# Patient Record
Sex: Male | Born: 1950 | Race: White | Hispanic: No | Smoking: Former smoker
Health system: Southern US, Community
[De-identification: ages and names within clinical notes are randomized; demographics above are authoritative.]

## PROBLEM LIST (undated history)

## (undated) DIAGNOSIS — K219 Gastro-esophageal reflux disease without esophagitis: Secondary | ICD-10-CM

## (undated) DIAGNOSIS — B019 Varicella without complication: Secondary | ICD-10-CM

## (undated) DIAGNOSIS — M1990 Unspecified osteoarthritis, unspecified site: Secondary | ICD-10-CM

## (undated) DIAGNOSIS — K635 Polyp of colon: Secondary | ICD-10-CM

## (undated) DIAGNOSIS — H269 Unspecified cataract: Secondary | ICD-10-CM

## (undated) HISTORY — DX: Polyp of colon: K63.5

## (undated) HISTORY — PX: OTHER SURGICAL HISTORY: SHX169

## (undated) HISTORY — PX: ANKLE SURGERY: SHX546

## (undated) HISTORY — PX: SPINE SURGERY: SHX786

## (undated) HISTORY — PX: COLONOSCOPY: SHX174

## (undated) HISTORY — PX: ROTATOR CUFF REPAIR: SHX139

## (undated) HISTORY — DX: Gastro-esophageal reflux disease without esophagitis: K21.9

## (undated) HISTORY — DX: Varicella without complication: B01.9

## (undated) HISTORY — PX: POLYPECTOMY: SHX149

## (undated) HISTORY — DX: Unspecified osteoarthritis, unspecified site: M19.90

## (undated) HISTORY — DX: Unspecified cataract: H26.9

---

## 1999-04-07 ENCOUNTER — Ambulatory Visit (HOSPITAL_COMMUNITY): Admission: RE | Admit: 1999-04-07 | Discharge: 1999-04-07 | Payer: Self-pay | Admitting: Neurosurgery

## 1999-05-26 ENCOUNTER — Observation Stay (HOSPITAL_COMMUNITY): Admission: RE | Admit: 1999-05-26 | Discharge: 1999-05-27 | Payer: Self-pay | Admitting: Neurosurgery

## 1999-08-01 ENCOUNTER — Encounter: Admission: RE | Admit: 1999-08-01 | Discharge: 1999-08-01 | Payer: Self-pay | Admitting: Neurosurgery

## 1999-10-07 ENCOUNTER — Encounter: Admission: RE | Admit: 1999-10-07 | Discharge: 1999-10-07 | Payer: Self-pay | Admitting: Neurosurgery

## 2000-01-06 ENCOUNTER — Encounter: Admission: RE | Admit: 2000-01-06 | Discharge: 2000-01-06 | Payer: Self-pay | Admitting: Neurosurgery

## 2000-01-14 ENCOUNTER — Ambulatory Visit (HOSPITAL_COMMUNITY): Admission: RE | Admit: 2000-01-14 | Discharge: 2000-01-14 | Payer: Self-pay | Admitting: Neurosurgery

## 2000-02-08 ENCOUNTER — Encounter: Admission: RE | Admit: 2000-02-08 | Discharge: 2000-02-29 | Payer: Self-pay | Admitting: Neurosurgery

## 2003-05-08 ENCOUNTER — Ambulatory Visit (HOSPITAL_COMMUNITY): Admission: RE | Admit: 2003-05-08 | Discharge: 2003-05-08 | Payer: Self-pay | Admitting: *Deleted

## 2003-05-08 ENCOUNTER — Encounter (INDEPENDENT_AMBULATORY_CARE_PROVIDER_SITE_OTHER): Payer: Self-pay | Admitting: *Deleted

## 2003-09-16 ENCOUNTER — Observation Stay (HOSPITAL_COMMUNITY): Admission: RE | Admit: 2003-09-16 | Discharge: 2003-09-17 | Payer: Self-pay | Admitting: Neurosurgery

## 2003-12-29 ENCOUNTER — Encounter: Admission: RE | Admit: 2003-12-29 | Discharge: 2004-03-28 | Payer: Self-pay | Admitting: Neurosurgery

## 2005-02-07 ENCOUNTER — Ambulatory Visit (HOSPITAL_COMMUNITY): Admission: RE | Admit: 2005-02-07 | Discharge: 2005-02-07 | Payer: Self-pay | Admitting: Neurosurgery

## 2005-08-28 ENCOUNTER — Inpatient Hospital Stay (HOSPITAL_COMMUNITY): Admission: RE | Admit: 2005-08-28 | Discharge: 2005-09-01 | Payer: Self-pay | Admitting: Neurosurgery

## 2008-02-18 ENCOUNTER — Encounter: Admission: RE | Admit: 2008-02-18 | Discharge: 2008-02-18 | Payer: Self-pay | Admitting: Neurosurgery

## 2010-06-22 ENCOUNTER — Ambulatory Visit: Payer: Self-pay | Admitting: Internal Medicine

## 2010-06-22 DIAGNOSIS — Z860100 Personal history of colon polyps, unspecified: Secondary | ICD-10-CM | POA: Insufficient documentation

## 2010-06-22 DIAGNOSIS — F3289 Other specified depressive episodes: Secondary | ICD-10-CM | POA: Insufficient documentation

## 2010-06-22 DIAGNOSIS — K219 Gastro-esophageal reflux disease without esophagitis: Secondary | ICD-10-CM | POA: Insufficient documentation

## 2010-06-22 DIAGNOSIS — F329 Major depressive disorder, single episode, unspecified: Secondary | ICD-10-CM | POA: Insufficient documentation

## 2010-10-30 LAB — CONVERTED CEMR LAB
ALT: 32 units/L (ref 0–53)
AST: 23 units/L (ref 0–37)
Albumin: 4 g/dL (ref 3.5–5.2)
Alkaline Phosphatase: 60 units/L (ref 39–117)
BUN: 15 mg/dL (ref 6–23)
Basophils Absolute: 0 10*3/uL (ref 0.0–0.1)
Basophils Relative: 0.6 % (ref 0.0–3.0)
Bilirubin, Direct: 0.1 mg/dL (ref 0.0–0.3)
CO2: 27 meq/L (ref 19–32)
Calcium: 9.2 mg/dL (ref 8.4–10.5)
Chloride: 106 meq/L (ref 96–112)
Cholesterol: 175 mg/dL (ref 0–200)
Creatinine, Ser: 0.8 mg/dL (ref 0.4–1.5)
Eosinophils Absolute: 0.2 10*3/uL (ref 0.0–0.7)
Eosinophils Relative: 4.4 % (ref 0.0–5.0)
GFR calc non Af Amer: 111.15 mL/min (ref >60)
Glucose, Bld: 95 mg/dL (ref 70–99)
HCT: 43.2 % (ref 39.0–52.0)
HDL: 37.9 mg/dL — ABNORMAL LOW (ref >39.00)
Hemoglobin: 14.9 g/dL (ref 13.0–17.0)
LDL Cholesterol: 112 mg/dL — ABNORMAL HIGH (ref 0–99)
Lymphocytes Relative: 32.4 % (ref 12.0–46.0)
Lymphs Abs: 1.4 10*3/uL (ref 0.7–4.0)
MCHC: 34.5 g/dL (ref 30.0–36.0)
MCV: 99.8 fL (ref 78.0–100.0)
Monocytes Absolute: 0.4 10*3/uL (ref 0.1–1.0)
Monocytes Relative: 9.3 % (ref 3.0–12.0)
Neutro Abs: 2.3 10*3/uL (ref 1.4–7.7)
Neutrophils Relative %: 53.3 % (ref 43.0–77.0)
Platelets: 268 10*3/uL (ref 150.0–400.0)
Potassium: 4.6 meq/L (ref 3.5–5.1)
RBC: 4.32 M/uL (ref 4.22–5.81)
RDW: 13.8 % (ref 11.5–14.6)
Sodium: 140 meq/L (ref 135–145)
TSH: 1.67 microintl units/mL (ref 0.35–5.50)
Total Bilirubin: 0.7 mg/dL (ref 0.3–1.2)
Total CHOL/HDL Ratio: 5
Total Protein: 6.8 g/dL (ref 6.0–8.3)
Triglycerides: 127 mg/dL (ref 0.0–149.0)
VLDL: 25.4 mg/dL (ref 0.0–40.0)
WBC: 4.4 10*3/uL — ABNORMAL LOW (ref 4.5–10.5)

## 2010-11-03 NOTE — Assessment & Plan Note (Signed)
Summary: PT IS REQ CPX/PT WILL COME IN FASTING/NJR  and a  Vital Signs:  Patient profile:   60 year old male Height:      72.5 inches Weight:      243 pounds BMI:     32.62 Temp:     98.3 degrees F oral BP sitting:   120 / 84  (left arm) Cuff size:   regular  Vitals Entered By: Duard Brady LPN (June 22, 2010 8:53 AM) CC: new pt - cpx  Is Patient Diabetic? No Flu Vaccine Consent Questions     Do you have a history of severe allergic reactions to this vaccine? no    Any prior history of allergic reactions to egg and/or gelatin? no    Do you have a sensitivity to the preservative Thimersol? no    Do you have a past history of Guillan-Barre Syndrome? no    Do you currently have an acute febrile illness? no    Have you ever had a severe reaction to latex? no    Vaccine information given and explained to patient? yes    Are you currently pregnant? no    Lot Number:[id number]   Exp Date:04/01/2011   Site Given  Left Deltoid IM   CC:  new pt - cpx .  History of Present Illness: 60 year old patient who is seen today to establish with her practice.  He has a history of labile hypertension and history of colonic polyps.  He is doing well today and is seen.  Basically to establish.  He has a history of mild gastroesophageal reflux disease.  He presently takes no medications.  He has seen Dr. Isabel Caprice  recently for a urological evaluation.  Preventive Screening-Counseling & Management  Caffeine-Diet-Exercise     Does Patient Exercise: yes  Allergies (verified): No Known Drug Allergies  Past History:  Past Medical History: Colonic polyps, hx of Depression GERD history of borderline hypertension  Past Surgical History: cervical spine surgery 2002 2005 2006 by Dr. Lovell Sheehan left ankle fracture and surgery 1994 bilateral shoulder surgery due to rotator cuff tears.  Dr. Madelon Lips  Colonoscopy.  2007  Family History: Reviewed history and no changes required. father late  9s.  History of colon cancer and chronic kidney disease mother died age 63 of dementia, early onset in her 70s one brother in good health two sisters history of obesity, coronary artery disease  Social History: Reviewed history and no changes required. Married Regular exercise-yes Does Patient Exercise:  yes  Review of Systems  The patient denies anorexia, fever, weight loss, weight gain, vision loss, decreased hearing, hoarseness, chest pain, syncope, dyspnea on exertion, peripheral edema, prolonged cough, headaches, hemoptysis, abdominal pain, melena, hematochezia, severe indigestion/heartburn, hematuria, incontinence, genital sores, muscle weakness, suspicious skin lesions, transient blindness, difficulty walking, depression, unusual weight change, abnormal bleeding, enlarged lymph nodes, angioedema, breast masses, and testicular masses.    Physical Exam  General:  overweight-appearing.  130/90overweight-appearing.   Head:  Normocephalic and atraumatic without obvious abnormalities. No apparent alopecia or balding. Eyes:  No corneal or conjunctival inflammation noted. EOMI. Perrla. Funduscopic exam benign, without hemorrhages, exudates or papilledema. Vision grossly normal. Ears:  External ear exam shows no significant lesions or deformities.  Otoscopic examination reveals clear canals, tympanic membranes are intact bilaterally without bulging, retraction, inflammation or discharge. Hearing is grossly normal bilaterally. Nose:  External nasal examination shows no deformity or inflammation. Nasal mucosa are pink and moist without lesions or exudates. Mouth:  Oral mucosa and oropharynx  without lesions or exudates.  Teeth in good repair. Neck:  No deformities, masses, or tenderness noted. Chest Wall:  No deformities, masses, tenderness or gynecomastia noted. Breasts:  No masses or gynecomastia noted Lungs:  Normal respiratory effort, chest expands symmetrically. Lungs are clear to  auscultation, no crackles or wheezes. Heart:  Normal rate and regular rhythm. S1 and S2 normal without gallop, murmur, click, rub or other extra sounds. Abdomen:  Bowel sounds positive,abdomen soft and non-tender without masses, organomegaly or hernias noted. Genitalia:  Testes bilaterally descended without nodularity, tenderness or masses. No scrotal masses or lesions. No penis lesions or urethral discharge. Msk:  No deformity or scoliosis noted of thoracic or lumbar spine.   scar posterior neck and left anterior neck scar left medial ankle Pulses:  R and L carotid,radial,femoral,dorsalis pedis and posterior tibial pulses are full and equal bilaterally Extremities:  No clubbing, cyanosis, edema, or deformity noted with normal full range of motion of all joints.   Neurologic:  No cranial nerve deficits noted. Station and gait are normal. Plantar reflexes are down-going bilaterally. DTRs are symmetrical throughout. Sensory, motor and coordinative functions appear intact. Skin:  Intact without suspicious lesions or rashes Cervical Nodes:  No lymphadenopathy noted Axillary Nodes:  No palpable lymphadenopathy Inguinal Nodes:  No significant adenopathy Psych:  Cognition and judgment appear intact. Alert and cooperative with normal attention span and concentration. No apparent delusions, illusions, hallucinations   Impression & Recommendations:  Problem # 1:  HEALTH SCREENING (ICD-V70.0)  Orders: EKG w/ Interpretation (93000)  Other Orders: Admin 1st Vaccine (16109) Flu Vaccine 14yrs + (60454) Venipuncture (09811) TLB-Lipid Panel (80061-LIPID) TLB-BMP (Basic Metabolic Panel-BMET) (80048-METABOL) TLB-CBC Platelet - w/Differential (85025-CBCD) TLB-Hepatic/Liver Function Pnl (80076-HEPATIC) TLB-TSH (Thyroid Stimulating Hormone) (84443-TSH)  Patient Instructions: 1)  Limit your Sodium (Salt). 2)  It is important that you exercise regularly at least 20 minutes 5 times a week. If you develop  chest pain, have severe difficulty breathing, or feel very tired , stop exercising immediately and seek medical attention. 3)  You need to lose weight. Consider a lower calorie diet and regular exercise.  4)  Check your Blood Pressure regularly. If it is above: 150/90,  you should make an appointment.  Appended Document: Orders Update    Clinical Lists Changes  Orders: Added new Service order of Specimen Handling (91478) - Signed

## 2011-02-17 NOTE — Op Note (Signed)
NAMEJAKAIDEN, FILL NO.:  [id number]   MEDICAL RECORD NO.:  [id number]          PATIENT TYPE:  INP   LOCATION:  3037                         FACILITY:  [facility]   PHYSICIAN:  Cristi Loron, M.D.DATE OF BIRTH:  10-30-1950   DATE OF PROCEDURE:  08/30/2005  DATE OF DISCHARGE:                                 OPERATIVE REPORT   BRIEF HISTORY:  The patient is a 60 year old white male who has suffered  from neck and arm pain.  He has undergone a C5-C6 anterior cervical  discectomy years ago and fused that well.  He subsequently underwent a C6-C7  anterior cervical discectomy with fusion and plating and got relief of his  arm pain but had persistent neck pain.  He was worked up with x-rays, etc.,  which demonstrated pseudoarthrosis at C6-C7.  I discussed the various  treatment options with the patient including surgery.  The patient weighed  the risks, benefits, and alternatives of surgery and decided to proceed with  a C6-C7 posterior cervical instrumentation and fusion.   PREOPERATIVE DIAGNOSIS:  C6-C7 pseudoarthrosis, cervicalgia.   POSTOPERATIVE DIAGNOSIS:  C6-C7 pseudoarthrosis, cervicalgia.   PROCEDURE:  C6-C7 posterior nonsegmental instrumentation, C6-C7 posterior  arthrodesis with VITOSS bone graft extender, local autograft bone, and bone  morphogenic protein (Infuse).   SURGEON:  Cristi Loron, M.D.   ASSISTANT:  Coletta Memos, M.D.   ANESTHESIA:  General endotracheal.   ESTIMATED BLOOD LOSS:  50 mL.   SPECIMENS:  None.   DRAINS:  None.   COMPLICATIONS:  None.   DESCRIPTION OF PROCEDURE:  The patient was brought to the operating room by  the anesthesia team.  General endotracheal anesthesia was induced.  The  Mayfield three point headrest had been applied to the patient's calvarium.  The patient had a craniectomy defect on the right.  We carefully avoided  this with pins.  The patient was then turned to the prone position on chest  rolls.   His posterior cervical region was then shaved and prepared with  Betadine scrub and Betadine solution.  Sterile drapes were applied.  I then  injected the area to be incised with Marcaine with epinephrine solution.  I  used a scalpel to make a linear midline incision at the cervical thoracic  junction.  I used electrocautery to perform a bilateral subperiosteal  dissection exposing the spinous process and lamina of C5, C6, and C7.  We  then obtained interoperative radiograph to confirm our location.  We then  inserted the [facility] retractor for exposure.  I then used the awl to  create an entry point in the lateral masses of C6 and C7 bilaterally.  We  then used the drill and the drill guide to create a 14 mm drill hole in the  lateral masses of C6 and C7 bilaterally aiming cephalad and somewhat  laterally.  I did not encounter any unexpected bleeding.  I then tapped  these drill holes and placed a 14 mm polyaxial screw bilaterally at C6 and  C7 and got good bone purchase.  I then used a high  speed drill to  decorticate the remainder of the lateral masses/facets, removing the facets  synovial lining, and decorticate the lateral aspect of the lamina.  We then  placed VITOSS over these decorticated posterolateral structures and then  laid an Infuse soaked collagen sponge over these decorticated posterolateral  structures completing the posterolateral arthrodesis.  We then obtained  hemostasis using bipolar electrocautery.  We removed the McCullough  retractor and then reapproximated the cervical thoracic fascia with  interrupted #1 Vicryl suture, the subcutaneous tissue with 2-0 Vicryl  suture, and the skin with Steri-Strips and Benzoin.  The wound was then  coated with Bacitracin ointment.  A sterile dressing was applied, the drapes  were removed.  The patient was subsequently returned to the supine position  where the Mayfield three point headrest was removed from his calvarium and  he  was subsequently extubated and transported to the post anesthesia care  unit in stable condition.  All sponge, instrument and needle counts were  correct at the end of the case.      Cristi Loron, M.D.  Electronically Signed     JDJ/MEDQ  D:  08/31/2005  T:  08/31/2005  Job:  [id number]

## 2011-02-17 NOTE — Op Note (Signed)
NAME:  Nathaniel Cross, Nathaniel Cross                           ACCOUNT NO.:  [id number]   MEDICAL RECORD NO.:  [id number]                   PATIENT TYPE:  INP   LOCATION:  3007                                 FACILITY:  [facility]   PHYSICIAN:  Cristi Loron, M.D.            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   OPERATION PERFORMED:  C6-7 extensive anterior cervical diskectomy,  decompression; interbody iliac crest allograft arthrodesis, anterior  cervical plating (Codman titanium plate and screws); removal of old C5-6  anterior cervical plate.   SURGEON:  Cristi Loron, M.D.   ASSISTANT:  Payton Doughty, M.D.   ANESTHESIA:  General endotracheal.   ESTIMATED BLOOD LOSS:  100 mL.   SPECIMENS:  None.   DRAINS:  None.   COMPLICATIONS:  None.   INDICATIONS FOR PROCEDURE:  The patient is a 60 year old white male on whom  I performed a C5-6 anterior cervical diskectomy, fusion and plating on a  couple of years ago.  He did very well at that surgery and but more recently  has developed neck and arm pain.  He failed medical management and was  worked up with a cervical MRI which demonstrated spondylosis and stenosis at  C6-7 on the left and signs and symptoms and physical exam were consistent  with a left C7 radiculopathy.  I discussed the various treatment options  with the patient including surgery.  The patient weighed the risks, benefits  and alternatives of surgery and decided to proceed with a C6-7 anterior  cervical diskectomy, fusion and plating.   DESCRIPTION OF PROCEDURE:  The patient was brought to the operating room by  the anesthesia team.  General  endotracheal anesthesia was induced. The  patient remained in supine position.  A roll was placed under his shoulders  to place his neck in slight extension.  His anterior cervical region was  then prepared with Betadine scrub and Betadine solution.  Sterile drapes  were applied.  I then injected the area to be incised with Marcaine with  epinephrine solution.  I used a scalpel to make a transverse incision  through the patient's pre-existing surgical scar.  I used the Metzenbaum  scissors to divide the platysma muscle and then to dissect medial to the  sternocleidomastoid muscle, jugular vein and carotid artery.  I bluntly  dissected down towards the anterior cervical spine, carefully identifying  the esophagus.  There was expected scar tissue.  We carefully dissected  through the scar tissue to free up the esophagus and used Kitner swabs to  clear the soft tissue from the anterior cervical spine.  We exposed the old  anterior cervical plate  at C5-6 and then unlocked the cans and then removed  the screws and then removed the plate while the esophagus was carefully  retracted out of harm's way with hand held retractors.  We then used  electrocautery to detach the medial border off the longus colli muscle  bilaterally from the C6-7 intervertebral disk space.  We inserted a Caspar  self-retaining retractor for exposure and then incised the C6-7  intervertebral disk with a 15 blade scalpel.  We performed partial  diskectomy using the pituitary forceps and the Carlens curets.  We inserted  distraction screws at C6 and C7 and then distracted the interspace.  We then  used the high speed drill to decorticate the vertebral end plates at V4-0,  drilled away the remainder of the C6-7 intervertebral disk thinning out the  posterior longitudinal ligament and drilling away some posterior  spondylosis.  We then incised the thinned out ligament with the arachnoid  knife and then removed it with the  Kerrison punch undercutting the vertebral  end plates decompressing the thecal sac at C6-7.  We then performed generous  foraminotomies about the bilateral C7 nerve roots and indeed encountered  some spondylosis, left greater than right which was compressing the exiting  left C7 nerve roots.  We got good decompression bilaterally.   We now turned our attention to the arthrodesis.  We obtained iliac crest  tricortical allograft bone graft and then fashioned it to the approximate  dimensions.  Approximately 6 mm in height, 1 cm in depth.  We inserted the  bone graft into the distracted C6-7 interspace and then removed the  distraction screws.  There was a good snug fit of the bone graft.  We now  turned our attention to the anterior spinal instrumentation.  We used the  high speed drill to drill off some ventral spondylosis so that the plate  will lie down flat.  We then used the patient's old plate.  I laid it along  the anterior aspect of the vertebral bodies of C6 and C7.  We drilled two  holes at C6, two at C7.  We then tapped the holes and secured the fixator  vertebral device by placing two screws at C6 and two at C7.  We then  obtained an intraoperative radiograph.  It demonstrated good position of the  plates, screws, interbody graft with limited visualization because of the  patient's body habitus.  The plate and screws all looked good in vivo.  We  then secured the screws and the plate by locking the cans at each screw.  We  then removed the Caspar self-retaining retractor.  We inspected the  esophagus for any damage, there was none apparent.  We then reapproximated  the patient's platysma muscle with interrupted 3-0 Vicryl suture,  subcutaneous tissue with interrupted 3-0 Vicryl suture and the skin with  skin with Steri-Strips and benzoin.  The wound was then coated with  bacitracin ointment.  A sterile dressing was applied.  The drapes were removed and the patient was  subsequently extubated by the anesthesia team  and transported to the post anesthesia care unit in stable condition.  All  sponge, needle and instrument counts were correct at the end of this case.  Cristi Loron, M.D.    JDJ/MEDQ  D:  09/16/2003  T:  09/17/2003  Job:  [id number]

## 2011-02-17 NOTE — Discharge Summary (Signed)
NAMEFILOMENO, CROMLEY NO.:  [id number]   MEDICAL RECORD NO.:  [id number]          PATIENT TYPE:  INP   LOCATION:  3037                         FACILITY:  [facility]   PHYSICIAN:  Cristi Loron, M.D.DATE OF BIRTH:  June 07, 1951   DATE OF ADMISSION:  08/28/2005  DATE OF DISCHARGE:  09/01/2005                                 DISCHARGE SUMMARY   BRIEF HISTORY:  The patient is a 60 year old white male who I had performed  anterior cervical discectomy, fusion, and plating on in 2000 and 2004.  The  patient has had some persistent neck pain.  I worked him up with x-rays and  cervical MRI which were consistent with a pseudoarthrosis at C6-C7.  I  discussed the various treatment options with the patient including medical  management versus anterior or posterior cervical fusion.  The patient  weighed the risks, benefits, and alternatives of surgery and decided to  proceed with a posterior cervical fusion.  For further details of this  admission, please refer to typed history and physical.   HOSPITAL COURSE:  I admitted the patient to [facility] on August 28, 2005, with a diagnosis of C6-C7 pseudoarthrosis.  On that day, we had  planned to do a posterior cervical fusion.  I placed a Mayfield three point  headrest on the patient's calvarium.  The headrest slipped somewhat and one  of the points went into his craniectomy defect.  I therefore removed the  Mayfield three point headrest, we took the patient down to the CT scanner  which demonstrated a very small what appeared to be venous epidural hematoma  on the right at the site of his craniectomy defect.  We woke the patient up  from anesthesia and he was neurologically unchanged from his preoperative  status.  We observed him in the ICU, repeated the CAT scan the next day and  it demonstrated no change in the epidural hematoma.  I, therefore, discussed  the situation with the patient and we decided to proceed with  the planned  surgery on August 30, 2005.   On August 30, 2005, I performed a C6-C7 posterior instrumentation and  fusion.  The surgery went well without complications (for full details of  the operation, please refer to the typed operative note).  The remainder of  the patient's postoperative course was unremarkable and by September 01, 2005,  the patient was afebrile, vital signs stable, he was eating well and  ambulating well.  His wound was healing well without signs of infection.  He  was neurologically normal and he was requesting discharge to home.  He was,  therefore, discharged to home on September 01, 2005.   FINAL DIAGNOSIS:  1.  C6-C7 pseudoarthrosis.  2.  Small right epidural hematoma.   PROCEDURE PERFORMED:  C6-c7 posterior nonsegmental instrumentation, C6-C7  posterior arthrodesis with VITOSS bone graft extender, local autograft bone  and bone morphogenic protein (Infuse).   DISCHARGE INSTRUCTIONS:  The patient was given written discharge  instructions and instructed to follow up with me in four weeks.   DISCHARGE  MEDICATIONS:  Percocet 10/325, number 100, 1 p.o. q.4h. p.r.n.  pain, Valium 5 mg, number 50, 1 p.o. q.6h. p.r.n. muscle spasms.      Cristi Loron, M.D.  Electronically Signed     JDJ/MEDQ  D:  10/12/2005  T:  10/12/2005  Job:  [id number]
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
## 2015-02-22 ENCOUNTER — Ambulatory Visit (INDEPENDENT_AMBULATORY_CARE_PROVIDER_SITE_OTHER): Payer: BLUE CROSS/BLUE SHIELD

## 2015-02-22 ENCOUNTER — Ambulatory Visit (INDEPENDENT_AMBULATORY_CARE_PROVIDER_SITE_OTHER): Payer: BLUE CROSS/BLUE SHIELD | Admitting: Podiatry

## 2015-02-22 ENCOUNTER — Encounter: Payer: Self-pay | Admitting: Podiatry

## 2015-02-22 VITALS — BP 141/85 | HR 82 | Resp 13 | Ht 72.0 in | Wt 218.0 lb

## 2015-02-22 DIAGNOSIS — M722 Plantar fascial fibromatosis: Secondary | ICD-10-CM

## 2015-02-22 MED ORDER — DICLOFENAC SODIUM 75 MG PO TBEC: 75.0000 mg | DELAYED_RELEASE_TABLET | Freq: Two times a day (BID) | ORAL | Status: DC

## 2015-02-22 MED ORDER — TRIAMCINOLONE ACETONIDE 10 MG/ML IJ SUSP
10.0000 mg | Freq: Once | INTRAMUSCULAR | Status: DC
Start: 2015-02-22 — End: 2016-05-04

## 2015-02-22 NOTE — Progress Notes (Signed)
   Subjective:    Patient ID: Nathaniel Cross, male    DOB: 08-30-1951, 64 y.o.   MRN: [id number]  HPI    Review of Systems  All other systems reviewed and are negative.      Objective:   Physical Exam        Assessment & Plan:

## 2015-02-22 NOTE — Patient Instructions (Signed)

## 2015-02-23 NOTE — Progress Notes (Signed)
Subjective:     Patient ID: Nathaniel Cross, male   DOB: Dec 23, 1950, 64 y.o.   MRN: [id number]  HPI patient states she's been very active but has had significant heel pain right over left for one year which is made hiking difficult for him. States that he is not able to do what he wants to do   Review of Systems  All other systems reviewed and are negative.      Objective:   Physical Exam  Constitutional: He is oriented to person, place, and time.  Cardiovascular: Intact distal pulses.   Musculoskeletal: Normal range of motion.  Neurological: He is oriented to person, place, and time.  Skin: Skin is warm and dry.  Nursing note and vitals reviewed.  neurovascular status found to be intact muscle strength was adequate with range of motion within normal limits. Moderate depression of the arch noted bilateral with quite a bit of discomfort in the plantar fascia right central and medial band. No other pathology was noted in the Achilles tendon or other heel like structure      Assessment:     Planter fasciitis noted right over left with inflammation and fluid around the medial band    Plan:     H&P and x-rays reviewed with patient and condition reviewed. Today I injected the right plantar fascia 3 mg Kenalog 5 mill grams Xylocaine and applied fascial brace and placed on diclofenac 75 mg twice a day. Discussed possibility for long-term orthotics and we'll see how he responds and decide what would be best but I would like to get him back to hiking again

## 2015-03-08 ENCOUNTER — Encounter: Payer: Self-pay | Admitting: Podiatry

## 2015-03-08 ENCOUNTER — Ambulatory Visit (INDEPENDENT_AMBULATORY_CARE_PROVIDER_SITE_OTHER): Payer: BLUE CROSS/BLUE SHIELD | Admitting: Podiatry

## 2015-03-08 VITALS — BP 147/99 | HR 80 | Resp 16

## 2015-03-08 DIAGNOSIS — M722 Plantar fascial fibromatosis: Secondary | ICD-10-CM | POA: Diagnosis not present

## 2015-03-10 NOTE — Progress Notes (Signed)
Subjective:     Patient ID: Nathaniel Cross, male   DOB: 27-Sep-1951, 64 y.o.   MRN: [id number]  HPI patient presents stating I'm improving but still having pain in my heel with continued discomfort with ambulation   Review of Systems     Objective:   Physical Exam Neurovascular status intact with continued discomfort in the plantar aspect of the heel with improvement upon palpation but is noted to have significant depression of the arch with fluid buildup    Assessment:     Plantar fasciitis right with inflammation and fluid around the medial band secondary to foot structure    Plan:     Reviewed condition and at this time recommended long-term orthotics and he is scanned for [facility] type orthotic device and also continued physical therapy and shoe gear modification reappoint when orthotics are returned

## 2015-03-23 ENCOUNTER — Telehealth: Payer: Self-pay | Admitting: *Deleted

## 2015-03-23 NOTE — Telephone Encounter (Signed)
Pt states he has received 1 cortisone injection and now has heel pain again and would like to be seen earlier than when orthotic come in.  Left message to call for an appt we would be happy to get him in sooner.

## 2015-03-25 ENCOUNTER — Ambulatory Visit (INDEPENDENT_AMBULATORY_CARE_PROVIDER_SITE_OTHER): Payer: BLUE CROSS/BLUE SHIELD | Admitting: Podiatry

## 2015-03-25 ENCOUNTER — Encounter: Payer: Self-pay | Admitting: Podiatry

## 2015-03-25 VITALS — BP 120/77 | HR 62 | Resp 12

## 2015-03-25 DIAGNOSIS — M722 Plantar fascial fibromatosis: Secondary | ICD-10-CM | POA: Diagnosis not present

## 2015-03-25 MED ORDER — TRIAMCINOLONE ACETONIDE 10 MG/ML IJ SUSP
10.0000 mg | Freq: Once | INTRAMUSCULAR | Status: AC
  Administered 2015-03-25: 10 mg

## 2015-03-27 NOTE — Progress Notes (Signed)
Subjective:     Patient ID: Nathaniel Cross, male   DOB: 12-Feb-1951, 64 y.o.   MRN: [id number]  HPI patient presents stating I'm doing pretty well with my medial heel but the outside is starting to bother me and it makes it hard for me to walk at times. Patient states that overall there is improvement   Review of Systems     Objective:   Physical Exam Neurovascular status intact muscle strength adequate with pain in the lateral side of the plantar fascial band in the insertional point to the calcaneus    Assessment:     Lateral band plantar fasciitis right    Plan:     H&P performed condition reviewed and lateral injection performed 3 mg Kenalog 5 mg Xylocaine. Reappoint to recheck

## 2016-05-04 ENCOUNTER — Ambulatory Visit (INDEPENDENT_AMBULATORY_CARE_PROVIDER_SITE_OTHER): Payer: Medicare HMO | Admitting: Adult Health

## 2016-05-04 ENCOUNTER — Encounter: Payer: Self-pay | Admitting: Adult Health

## 2016-05-04 VITALS — BP 142/82 | Temp 98.6°F | Ht 72.0 in | Wt 219.9 lb

## 2016-05-04 DIAGNOSIS — Z7189 Other specified counseling: Secondary | ICD-10-CM

## 2016-05-04 DIAGNOSIS — R030 Elevated blood-pressure reading, without diagnosis of hypertension: Secondary | ICD-10-CM

## 2016-05-04 DIAGNOSIS — Z1211 Encounter for screening for malignant neoplasm of colon: Secondary | ICD-10-CM | POA: Diagnosis not present

## 2016-05-04 DIAGNOSIS — IMO0001 Reserved for inherently not codable concepts without codable children: Secondary | ICD-10-CM

## 2016-05-04 DIAGNOSIS — Z23 Encounter for immunization: Secondary | ICD-10-CM | POA: Diagnosis not present

## 2016-05-04 DIAGNOSIS — Z7689 Persons encountering health services in other specified circumstances: Secondary | ICD-10-CM

## 2016-05-04 NOTE — Progress Notes (Signed)
Patient presents to clinic today to establish care. He is a pleasant 65 year old caucasian male who  has a past medical history of Arthritis; Chicken pox; Colon polyps; and GERD (gastroesophageal reflux disease).   His last physical was about 6 years ago.    Acute Concerns: Establish Care  Chronic Issues: Arthritis  - Takes Mobic when needed for arthritic pain   Health Maintenance: Dental -- Does see on occassion [facility] -- Yearly Immunizations -- Needs pneumonia vaccinations Colonoscopy -- 2009 - Had colon polyps  Diet: Has started eating healthy Exercise: He is active with outdoor activities.   Is followed by  - Urology - yearly ( but does not know why)  - Orthopedics    Past Medical History:  Diagnosis Date  . Arthritis   . Chicken pox   . Colon polyps   . GERD (gastroesophageal reflux disease)     Past Surgical History:  Procedure Laterality Date  . ANKLE SURGERY Left    talus surgery per pt  . lump surgery     lump in groin as child  . ROTATOR CUFF REPAIR Bilateral   . SPINE SURGERY      No current outpatient prescriptions on file prior to visit.   No current facility-administered medications on file prior to visit.     No Known Allergies  Family History  Problem Relation Age of Onset  . Arthritis Father   . Colon cancer Father   . Alzheimer's disease Mother     Social History   Social History  . Marital status: Married    Spouse name: N/A  . Number of children: N/A  . Years of education: N/A   Occupational History  . Not on file.   Social History Main Topics  . Smoking status: Former Research scientist (life sciences)  . Smokeless tobacco: Not on file  . Alcohol use 0.0 oz/week     Comment: socially   . Drug use:     Types: Marijuana  . Sexual activity: Not on file   Other Topics Concern  . Not on file   Social History Narrative  . No narrative on file    Review of Systems  Constitutional: Negative.   Respiratory: Negative.   Cardiovascular:  Negative.   Genitourinary: Negative.   Musculoskeletal: Negative.   Skin: Negative.   Neurological: Negative.   All other systems reviewed and are negative.   BP (!) 142/82   Temp 98.6 F (37 C) (Oral)   Ht 6' (1.829 m)   Wt 219 lb 14.4 oz (99.7 kg)   BMI 29.82 kg/m   Physical Exam  Constitutional: He is oriented to person, place, and time. No distress.  Cardiovascular: Normal rate, regular rhythm, normal heart sounds and intact distal pulses.  Exam reveals no gallop.   No murmur heard. Pulmonary/Chest: Effort normal and breath sounds normal. No respiratory distress. He has no wheezes. He has no rales. He exhibits no tenderness.  Neurological: He is alert and oriented to person, place, and time. Gait normal. GCS score is 15.  Skin: Skin is warm and dry. No rash noted. He is not diaphoretic. No erythema. No pallor.  Psychiatric: Mood, memory, affect and judgment normal.  Nursing note and vitals reviewed.   Assessment/Plan: 1. Encounter to establish care - Follow up for CPE - Follow up sooner if needed - Continue to eat healthy and exercise.   2. Colon cancer screening - Ambulatory referral to Gastroenterology  3. Elevated blood pressure - Will  continue to monitor.  - He would like to try and lose weight before going on blood pressure medication - Will follow up with this during his physical   4. Need for prophylactic vaccination against Streptococcus pneumoniae (pneumococcus) - Pneumococcal conjugate vaccine 13-valent IM   Dorothyann Peng, NP

## 2016-05-04 NOTE — Patient Instructions (Signed)
It was great meeting you today  Please follow up with me for your physical. If you need anything in the meantime, please let me know.   We will continue to monitor your blood pressure but I have a feeling with some additional weight loss, the blood pressure will come down

## 2016-07-06 ENCOUNTER — Encounter: Payer: Self-pay | Admitting: Adult Health

## 2016-08-10 ENCOUNTER — Encounter: Payer: Medicare HMO | Admitting: Family Medicine

## 2016-11-02 ENCOUNTER — Encounter: Payer: Medicare HMO | Admitting: Family Medicine

## 2017-03-22 ENCOUNTER — Encounter: Payer: Medicare HMO | Admitting: Family Medicine

## 2017-06-19 NOTE — Progress Notes (Addendum)
Medicare Annual Preventive Care Visit  (initial annual wellness or annual wellness exam)  Concerns and/or follow up today: Here for AWV. We have never met before and he is not sure why this was scheduled with me. Saw Dorothyann Peng to establish care last year. Denies any concerns today. Reports healthy diet and regular activity - had to cut back from 7 miles per day with some plantar fascitis issues earlier this year, but this has been improving. Now plans to get back in to exercising.  See HM section in Epic for other details of completed HM. See scanned documentation under Media Tab for further documentation HPI, health risk assessment. See Media Tab and Care Teams sections in Epic for other providers.  ROS: negative for report of fevers, unintentional weight loss, vision changes, vision loss, hearing loss or change, chest pain, sob, hemoptysis, melena, hematochezia, hematuria, genital discharge or lesions, falls, bleeding or bruising, loc, thoughts of suicide or self harm, memory loss  1.) Patient-completed health risk assessment  - completed and reviewed, see scanned documentation  2.) Review of Medical History: -PMH, PSH, Family History and current specialty and care providers reviewed and updated and listed below  - see scanned in document in chart and below  Past Medical History:  Diagnosis Date  . Arthritis   . Chicken pox   . Colon polyps   . GERD (gastroesophageal reflux disease)     Past Surgical History:  Procedure Laterality Date  . ANKLE SURGERY Left    talus surgery per pt  . lump surgery     lump in groin as child  . ROTATOR CUFF REPAIR Bilateral   . SPINE SURGERY      Social History   Social History  . Marital status: Widowed    Spouse name: N/A  . Number of children: N/A  . Years of education: N/A   Occupational History  . Not on file.   Social History Main Topics  . Smoking status: Former Research scientist (life sciences)  . Smokeless tobacco: Never Used  . Alcohol use No    Comment: socially   . Drug use: Yes    Types: Marijuana     Comment: "2-3 hits at night to help w/ anxiety"  . Sexual activity: Yes   Other Topics Concern  . Not on file   Social History Narrative   Retired    Has a girlfriend    No children       He likes to hike, read, kayak     Family History  Problem Relation Age of Onset  . Arthritis Father   . Colon cancer Father   . Alzheimer's disease Mother     No current outpatient prescriptions on file prior to visit.   No current facility-administered medications on file prior to visit.      3.) Review of functional ability and level of safety:  Any difficulty hearing?  See scanned documentation  History of falling?  See scanned documentation  Any trouble with IADLs - using a phone, using transportation, grocery shopping, preparing meals, doing housework, doing laundry, taking medications and managing money?  See scanned documentation  Advance Directives?  Discussed briefly and offered more resources and detailed discussion with our trained staff.   See summary of recommendations in Patient Instructions below.  4.) Physical Exam Vitals:   06/21/17 0715  BP: 128/90  Pulse: 89  Resp: 16  Temp: 98.4 F (36.9 C)  SpO2: 98%   Estimated body mass index is 30.94 kg/m  as calculated from the following:   Height as of this encounter: 6' (1.829 m).   Weight as of this encounter: 228 lb 1.6 oz (103.5 kg).  EKG (optional): deferred  General: alert, appear well hydrated and in no acute distress  HEENT: visual acuity grossly intact  CV: HRRR  Lungs: CTA bilaterally  Psych: pleasant and cooperative, no obvious depression or anxiety  Cognitive function grossly intact  See patient instructions for recommendations.  Education and counseling regarding the above review of health provided with a plan for the following: -see scanned patient completed form for further details -fall prevention strategies discussed    -healthy lifestyle discussed -importance and resources for completing advanced directives discussed -see patient instructions below for any other recommendations provided  4)The following written screening schedule of preventive measures were reviewed with assessment and plan made per below, orders and patient instructions:      AAA screening done if applicable     Alcohol screening done     Obesity Screening and counseling done     STI screening (Hep C if born [birth year]) offered and per pt wishes     Tobacco Screening done       Pneumococcal (PPSV23 -one dose after 64, one before if risk factors), influenza yearly and hepatitis B vaccines (if high risk - end stage renal disease, IV drugs, homosexual men, live in home for mentally retarded, hemophilia receiving factors) ASSESSMENT/PLAN: done - had today      Prostate cancer screening ASSESSMENT/PLAN: n/a, declined      Colorectal cancer screening (FOBT yearly or flex sig q4y or colonoscopy q10y or barium enema q4y) ASSESSMENT/PLAN: referral placed as seems may be past due and FH      Diabetes outpatient self-management training services ASSESSMENT/PLAN: utd or done      Bone mass measurements(covered q2y if indicated - estrogen def, osteoporosis, hyperparathyroid, vertebral abnormalities, osteoporosis or steroids) ASSESSMENT/PLAN: n/a      Screening for glaucoma(q1y if high risk - diabetes, FH, AA and > 50 or hispanic and > 65) ASSESSMENT/PLAN: utd or advised      Medical nutritional therapy for individuals with diabetes or renal disease ASSESSMENT/PLAN: see orders      Cardiovascular screening blood tests (lipids q5y) ASSESSMENT/PLAN: see orders and labs      Diabetes screening tests ASSESSMENT/PLAN: see orders and labs   7.) Summary:   Medicare annual wellness visit, subsequent - Plan: Lipid panel, Hemoglobin A1c, CANCELED: PSA -risk factors and conditions per above assessment were discussed and treatment,  recommendations and referrals were offered per documentation above and orders and patient instructions.  Need for pneumococcal vaccination - Plan: Pneumococcal polysaccharide vaccine 23-valent greater than or equal to 2yo subcutaneous/IM  Encounter for hepatitis C virus screening test for high risk patient - Plan: Hepatitis C antibody  Colon cancer screening - Plan: Ambulatory referral to Gastroenterology  DBP up a little, he plans to ease back into exercising gradually. Advised PCP follow up in 3-6 months to recheck.  Patient Instructions   BEFORE YOU LEAVE: -Fall/depression screening -advanced directives -labs -follow up: 4-6 months with Beaulah Dinning  -We placed a referral for you as discussed to the gastroenterologist for colon cancer screening. It usually takes about 1-2 weeks to process and schedule this referral. If you have not heard from [place] regarding this appointment in 2 weeks please contact our office.  We have ordered labs or studies at this visit. It can take up to 1-2 weeks for results and processing.  IF results require follow up or explanation, we will call you with instructions. Clinically stable results will be released to your [facility]. If you have not heard from [place] or cannot find your results in [facility] in 2 weeks please contact our office at [phone].  If you are not yet signed up for [facility], please consider signing up.   Mr. Nathaniel Cross , Thank you for taking time to come for your Medicare Wellness Visit. I appreciate your ongoing commitment to your health goals. Please review the following plan we discussed and let me know if I can assist you in the future.   These are the goals we discussed: Goals    Get your colon cancer screening      This is a list of the screening recommended for you and due dates:  Health Maintenance  Topic Date Due  .  Hepatitis C: One time screening is recommended by Center for Disease Control  (CDC) for  adults born from [birth year] through  1965.   Doing today  . Tetanus Vaccine  05/06/1969 - decilned  . Colon Cancer Screening  05/06/2000 - sent referral today  . Flu Shot  12/26/2017* - declined  . Pneumonia vaccines  Completed - done today  *Topic was postponed. The date shown is not the original due date.    Health Maintenance, Male A healthy lifestyle and preventive care is important for your health and wellness. Ask your health care provider about what schedule of regular examinations is right for you. What should I know about weight and diet? Eat a Healthy Diet  Eat plenty of vegetables, fruits, whole grains, low-fat dairy products, and lean protein.  Do not eat a lot of foods high in solid fats, added sugars, or salt.  Maintain a Healthy Weight Regular exercise can help you achieve or maintain a healthy weight. You should:  Do at least 150 minutes of exercise each week. The exercise should increase your heart rate and make you sweat (moderate-intensity exercise).  Do strength-training exercises at least twice a week.  Watch Your Levels of Cholesterol and Blood Lipids  Have your blood tested for lipids and cholesterol every 5 years starting at 66 years of age. If you are at high risk for heart disease, you should start having your blood tested when you are 67 years old. You may need to have your cholesterol levels checked more often if: ? Your lipid or cholesterol levels are high. ? You are older than 67 years of age. ? You are at high risk for heart disease.  What should I know about cancer screening? Many types of cancers can be detected early and may often be prevented. Lung Cancer  You should be screened every year for lung cancer if: ? You are a current smoker who has smoked for at least 30 years. ? You are a former smoker who has quit within the past 15 years.  Talk to your health care provider about your screening options, when you should start screening, and how often you should be  screened.  Colorectal Cancer  Routine colorectal cancer screening usually begins at 67 years of age and should be repeated every 5-10 years until you are 67 years old. You may need to be screened more often if early forms of precancerous polyps or small growths are found. Your health care provider may recommend screening at an earlier age if you have risk factors for colon cancer.  Your health care provider may recommend using home test  kits to check for hidden blood in the stool.  A small camera at the end of a tube can be used to examine your colon (sigmoidoscopy or colonoscopy). This checks for the earliest forms of colorectal cancer.  Prostate and Testicular Cancer  Depending on your age and overall health, your health care provider may do certain tests to screen for prostate and testicular cancer.  Talk to your health care provider about any symptoms or concerns you have about testicular or prostate cancer.  Skin Cancer  Check your skin from head to toe regularly.  Tell your health care provider about any new moles or changes in moles, especially if: ? There is a change in a mole's size, shape, or color. ? You have a mole that is larger than a pencil eraser.  Always use sunscreen. Apply sunscreen liberally and repeat throughout the day.  Protect yourself by wearing long sleeves, pants, a wide-brimmed hat, and sunglasses when outside.  What should I know about heart disease, diabetes, and high blood pressure?  If you are 25-75 years of age, have your blood pressure checked every 3-5 years. If you are 16 years of age or older, have your blood pressure checked every year. You should have your blood pressure measured twice-once when you are at a hospital or clinic, and once when you are not at a hospital or clinic. Record the average of the two measurements. To check your blood pressure when you are not at a hospital or clinic, you can use: ? An automated blood pressure machine at a  pharmacy. ? A home blood pressure monitor.  Talk to your health care provider about your target blood pressure.  If you are between 59-79 years old, ask your health care provider if you should take aspirin to prevent heart disease.  Have regular diabetes screenings by checking your fasting blood sugar level. ? If you are at a normal weight and have a low risk for diabetes, have this test once every three years after the age of 70. ? If you are overweight and have a high risk for diabetes, consider being tested at a younger age or more often.  A one-time screening for abdominal aortic aneurysm (AAA) by ultrasound is recommended for men aged 57-75 years who are current or former smokers. What should I know about preventing infection? Hepatitis B If you have a higher risk for hepatitis B, you should be screened for this virus. Talk with your health care provider to find out if you are at risk for hepatitis B infection. Hepatitis C Blood testing is recommended for:  Everyone born from [birth year] through 1965.  Anyone with known risk factors for hepatitis C.  Sexually Transmitted Diseases (STDs)  You should be screened each year for STDs including gonorrhea and chlamydia if: ? You are sexually active and are younger than 67 years of age. ? You are older than 67 years of age and your health care provider tells you that you are at risk for this type of infection. ? Your sexual activity has changed since you were last screened and you are at an increased risk for chlamydia or gonorrhea. Ask your health care provider if you are at risk.  Talk with your health care provider about whether you are at high risk of being infected with HIV. Your health care provider may recommend a prescription medicine to help prevent HIV infection.  What else can I do?  Schedule regular health, dental, and eye exams.  Stay  current with your vaccines (immunizations).  Do not use any tobacco products, such as  cigarettes, chewing tobacco, and e-cigarettes. If you need help quitting, ask your health care provider.  Limit alcohol intake to no more than 2 drinks per day. One drink equals 12 ounces of beer, 5 ounces of wine, or 1 ounces of hard liquor.  Do not use street drugs.  Do not share needles.  Ask your health care provider for help if you need support or information about quitting drugs.  Tell your health care provider if you often feel depressed.  Tell your health care provider if you have ever been abused or do not feel safe at home. This information is not intended to replace advice given to you by your health care provider. Make sure you discuss any questions you have with your health care provider. Document Released: 03/16/2008 Document Revised: 05/17/2016 Document Reviewed: 06/22/2015 Elsevier Interactive Patient Education  2018 [place]

## 2017-06-21 ENCOUNTER — Encounter: Payer: Self-pay | Admitting: Family Medicine

## 2017-06-21 ENCOUNTER — Encounter: Payer: Self-pay | Admitting: Gastroenterology

## 2017-06-21 ENCOUNTER — Ambulatory Visit (INDEPENDENT_AMBULATORY_CARE_PROVIDER_SITE_OTHER): Payer: Medicare HMO | Admitting: Family Medicine

## 2017-06-21 VITALS — BP 128/90 | HR 89 | Temp 98.4°F | Resp 16 | Ht 72.0 in | Wt 228.1 lb

## 2017-06-21 DIAGNOSIS — Z1159 Encounter for screening for other viral diseases: Secondary | ICD-10-CM

## 2017-06-21 DIAGNOSIS — Z9189 Other specified personal risk factors, not elsewhere classified: Secondary | ICD-10-CM | POA: Diagnosis not present

## 2017-06-21 DIAGNOSIS — Z23 Encounter for immunization: Secondary | ICD-10-CM | POA: Diagnosis not present

## 2017-06-21 DIAGNOSIS — Z0000 Encounter for general adult medical examination without abnormal findings: Secondary | ICD-10-CM

## 2017-06-21 DIAGNOSIS — Z1211 Encounter for screening for malignant neoplasm of colon: Secondary | ICD-10-CM

## 2017-06-21 DIAGNOSIS — R768 Other specified abnormal immunological findings in serum: Secondary | ICD-10-CM | POA: Diagnosis not present

## 2017-06-21 LAB — LIPID PANEL
CHOL/HDL RATIO: 3
CHOLESTEROL: 140 mg/dL (ref 0–200)
HDL: 48 mg/dL (ref >39.00)
LDL Cholesterol: 70 mg/dL (ref 0–99)
NonHDL: 92.18
Triglycerides: 109 mg/dL (ref 0.0–149.0)
VLDL: 21.8 mg/dL (ref 0.0–40.0)

## 2017-06-21 LAB — HEMOGLOBIN A1C: Hgb A1c MFr Bld: 5.4 % (ref 4.6–6.5)

## 2017-06-21 NOTE — Patient Instructions (Addendum)
BEFORE YOU LEAVE: -Fall/depression screening -advanced directives -labs -follow up: 4-6 months with Beaulah Dinning  -We placed a referral for you as discussed to the gastroenterologist for colon cancer screening. It usually takes about 1-2 weeks to process and schedule this referral. If you have not heard from [place] regarding this appointment in 2 weeks please contact our office.  We have ordered labs or studies at this visit. It can take up to 1-2 weeks for results and processing. IF results require follow up or explanation, we will call you with instructions. Clinically stable results will be released to your [facility]. If you have not heard from [place] or cannot find your results in [facility] in 2 weeks please contact our office at [phone].  If you are not yet signed up for [facility], please consider signing up.   Nathaniel Cross , Thank you for taking time to come for your Medicare Wellness Visit. I appreciate your ongoing commitment to your health goals. Please review the following plan we discussed and let me know if I can assist you in the future.   These are the goals we discussed: Goals    Get your colon cancer screening      This is a list of the screening recommended for you and due dates:  Health Maintenance  Topic Date Due  .  Hepatitis C: One time screening is recommended by Center for Disease Control  (CDC) for  adults born from [birth year] through 1966.   Doing today  . Tetanus Vaccine  05/06/1969 - decilned  . Colon Cancer Screening  05/06/2000 - sent referral today  . Flu Shot  12/26/2017* - declined  . Pneumonia vaccines  Completed - done today  *Topic was postponed. The date shown is not the original due date.    Health Maintenance, Male A healthy lifestyle and preventive care is important for your health and wellness. Ask your health care provider about what schedule of regular examinations is right for you. What should I know about weight and diet? Eat a Healthy Diet  Eat plenty  of vegetables, fruits, whole grains, low-fat dairy products, and lean protein.  Do not eat a lot of foods high in solid fats, added sugars, or salt.  Maintain a Healthy Weight Regular exercise can help you achieve or maintain a healthy weight. You should:  Do at least 150 minutes of exercise each week. The exercise should increase your heart rate and make you sweat (moderate-intensity exercise).  Do strength-training exercises at least twice a week.  Watch Your Levels of Cholesterol and Blood Lipids  Have your blood tested for lipids and cholesterol every 5 years starting at 67 years of age. If you are at high risk for heart disease, you should start having your blood tested when you are 67 years old. You may need to have your cholesterol levels checked more often if: ? Your lipid or cholesterol levels are high. ? You are older than 67 years of age. ? You are at high risk for heart disease.  What should I know about cancer screening? Many types of cancers can be detected early and may often be prevented. Lung Cancer  You should be screened every year for lung cancer if: ? You are a current smoker who has smoked for at least 30 years. ? You are a former smoker who has quit within the past 15 years.  Talk to your health care provider about your screening options, when you should start screening, and how often  you should be screened.  Colorectal Cancer  Routine colorectal cancer screening usually begins at 67 years of age and should be repeated every 5-10 years until you are 67 years old. You may need to be screened more often if early forms of precancerous polyps or small growths are found. Your health care provider may recommend screening at an earlier age if you have risk factors for colon cancer.  Your health care provider may recommend using home test kits to check for hidden blood in the stool.  A small camera at the end of a tube can be used to examine your colon (sigmoidoscopy  or colonoscopy). This checks for the earliest forms of colorectal cancer.  Prostate and Testicular Cancer  Depending on your age and overall health, your health care provider may do certain tests to screen for prostate and testicular cancer.  Talk to your health care provider about any symptoms or concerns you have about testicular or prostate cancer.  Skin Cancer  Check your skin from head to toe regularly.  Tell your health care provider about any new moles or changes in moles, especially if: ? There is a change in a mole's size, shape, or color. ? You have a mole that is larger than a pencil eraser.  Always use sunscreen. Apply sunscreen liberally and repeat throughout the day.  Protect yourself by wearing long sleeves, pants, a wide-brimmed hat, and sunglasses when outside.  What should I know about heart disease, diabetes, and high blood pressure?  If you are 56-40 years of age, have your blood pressure checked every 3-5 years. If you are 65 years of age or older, have your blood pressure checked every year. You should have your blood pressure measured twice-once when you are at a hospital or clinic, and once when you are not at a hospital or clinic. Record the average of the two measurements. To check your blood pressure when you are not at a hospital or clinic, you can use: ? An automated blood pressure machine at a pharmacy. ? A home blood pressure monitor.  Talk to your health care provider about your target blood pressure.  If you are between 29-67 years old, ask your health care provider if you should take aspirin to prevent heart disease.  Have regular diabetes screenings by checking your fasting blood sugar level. ? If you are at a normal weight and have a low risk for diabetes, have this test once every three years after the age of 42. ? If you are overweight and have a high risk for diabetes, consider being tested at a younger age or more often.  A one-time screening  for abdominal aortic aneurysm (AAA) by ultrasound is recommended for men aged 62-75 years who are current or former smokers. What should I know about preventing infection? Hepatitis B If you have a higher risk for hepatitis B, you should be screened for this virus. Talk with your health care provider to find out if you are at risk for hepatitis B infection. Hepatitis C Blood testing is recommended for:  Everyone born from [birth year] through 1965.  Anyone with known risk factors for hepatitis C.  Sexually Transmitted Diseases (STDs)  You should be screened each year for STDs including gonorrhea and chlamydia if: ? You are sexually active and are younger than 67 years of age. ? You are older than 67 years of age and your health care provider tells you that you are at risk for this type of infection. ?  Your sexual activity has changed since you were last screened and you are at an increased risk for chlamydia or gonorrhea. Ask your health care provider if you are at risk.  Talk with your health care provider about whether you are at high risk of being infected with HIV. Your health care provider may recommend a prescription medicine to help prevent HIV infection.  What else can I do?  Schedule regular health, dental, and eye exams.  Stay current with your vaccines (immunizations).  Do not use any tobacco products, such as cigarettes, chewing tobacco, and e-cigarettes. If you need help quitting, ask your health care provider.  Limit alcohol intake to no more than 2 drinks per day. One drink equals 12 ounces of beer, 5 ounces of wine, or 1 ounces of hard liquor.  Do not use street drugs.  Do not share needles.  Ask your health care provider for help if you need support or information about quitting drugs.  Tell your health care provider if you often feel depressed.  Tell your health care provider if you have ever been abused or do not feel safe at home. This information is not intended  to replace advice given to you by your health care provider. Make sure you discuss any questions you have with your health care provider. Document Released: 03/16/2008 Document Revised: 05/17/2016 Document Reviewed: 06/22/2015 Elsevier Interactive Patient Education  [employer].

## 2017-06-22 ENCOUNTER — Encounter: Payer: Self-pay | Admitting: Family Medicine

## 2017-06-22 DIAGNOSIS — B1920 Unspecified viral hepatitis C without hepatic coma: Secondary | ICD-10-CM | POA: Insufficient documentation

## 2017-06-25 LAB — HCV RNA,QUANTITATIVE REAL TIME PCR
HCV Quantitative Log: 1.53 Log IU/mL — ABNORMAL HIGH
HCV RNA, PCR, QN: 34 IU/mL — ABNORMAL HIGH

## 2017-06-25 LAB — HEPATITIS C ANTIBODY
Hepatitis C Ab: REACTIVE — AB
SIGNAL TO CUT-OFF: 12 — ABNORMAL HIGH (ref <1.00)

## 2017-06-25 NOTE — Addendum Note (Signed)
Addended by: Agnes Lawrence on: 06/25/2017 08:29 AM   Modules accepted: Orders

## 2017-07-16 DIAGNOSIS — B182 Chronic viral hepatitis C: Secondary | ICD-10-CM | POA: Diagnosis not present

## 2017-07-17 ENCOUNTER — Other Ambulatory Visit: Payer: Self-pay | Admitting: Nurse Practitioner

## 2017-07-17 DIAGNOSIS — B182 Chronic viral hepatitis C: Secondary | ICD-10-CM

## 2017-07-27 ENCOUNTER — Ambulatory Visit
Admission: RE | Admit: 2017-07-27 | Discharge: 2017-07-27 | Disposition: A | Discharge status: Home / Self Care | Payer: Medicare HMO | Source: Ambulatory Visit | Attending: Nurse Practitioner | Admitting: Nurse Practitioner

## 2017-07-27 DIAGNOSIS — B182 Chronic viral hepatitis C: Secondary | ICD-10-CM

## 2017-07-27 DIAGNOSIS — I714 Abdominal aortic aneurysm, without rupture: Secondary | ICD-10-CM | POA: Diagnosis not present

## 2017-08-14 ENCOUNTER — Encounter: Payer: Self-pay | Admitting: Gastroenterology

## 2017-08-14 ENCOUNTER — Ambulatory Visit (INDEPENDENT_AMBULATORY_CARE_PROVIDER_SITE_OTHER): Payer: Medicare HMO | Admitting: Gastroenterology

## 2017-08-14 VITALS — BP 112/80 | HR 74 | Ht 72.0 in | Wt 224.1 lb

## 2017-08-14 DIAGNOSIS — Z86010 Personal history of colonic polyps: Secondary | ICD-10-CM | POA: Diagnosis not present

## 2017-08-14 MED ORDER — NA SULFATE-K SULFATE-MG SULF 17.5-3.13-1.6 GM/177ML PO SOLN: 1.0000 | Freq: Once | ORAL | 0 refills | Status: AC

## 2017-08-14 NOTE — Patient Instructions (Addendum)
You will be set up for a colonoscopy ([place]) for polyps surveillance.  Normal BMI (Body Mass Index- based on height and weight) is between 23 and 30. Your BMI today is Body mass index is 30.4 kg/m. Marland Kitchen Please consider follow up  regarding your BMI with your Primary Care Provider.

## 2017-08-14 NOTE — Progress Notes (Signed)
HPI: This is a   very pleasant 66 year old man who was referred to me by Lucretia Kern, DO  to evaluate colon cancer screening, polyp surveillance.    Chief complaint is personal history of precancerous polyps  He believes he's had two colonoscopies, he believes 2004 was the most recent.  He has hemorrhoids, occasionally they bleed for many years.  No bowel changes.  His father had colon cancer (diagnosed in his 43s). Also Paternal Aunt.  I do not have any of the actual colonoscopy reports but I was able to find some old pathology from 2004.  Old Data Reviewed: 2004 path report: 1. ENDOSCOPIC BIOPSY, SPLENIC FLEXURE COLON AT 50 CM: INFLAMEDHYPERPLASTIC POLYP. 2. ENDOSCOPIC BIOPSY, COLON POLYP AT 30 CM: ADENOMATOUSPOLYP(S). NO HIGH GRADE DYSPLASIA OR INVASIVE MALIGNANC IDENTIFIED.    Review of systems: Pertinent positive and negative review of systems were noted in the above HPI section. All other review negative.   Past Medical History:  Diagnosis Date  . Arthritis   . Chicken pox   . Colon polyps   . GERD (gastroesophageal reflux disease)     Past Surgical History:  Procedure Laterality Date  . ANKLE SURGERY Left    talus surgery per pt  . lump surgery     lump in groin as child  . ROTATOR CUFF REPAIR Bilateral   . SPINE SURGERY      Current Outpatient Medications  Medication Sig Dispense Refill  . ASHWAGANDHA PO Take by mouth.    Marland Kitchen HAWTHORN BERRY PO Take by mouth.    . meloxicam (MOBIC) 15 MG tablet Take 15 mg by mouth daily as needed for pain.    . Misc Natural Products (DANDELION ROOT PO) Take by mouth.    . Tragacanth (ASTRAGALUS ROOT) POWD by Does not apply route daily.    . Turmeric, Curcuma Longa, (TURMERIC ROOT) POWD by Does not apply route.    Marland Kitchen VALERIAN ROOT PO Take by mouth.     No current facility-administered medications for this visit.     Allergies as of 08/14/2017  . (No Known Allergies)    Family History  Problem Relation Age of  Onset  . Arthritis Father   . Colon cancer Father   . Alzheimer's disease Mother     Social History   Socioeconomic History  . Marital status: Widowed    Spouse name: Not on file  . Number of children: Not on file  . Years of education: Not on file  . Highest education level: Not on file  Social Needs  . Financial resource strain: Not on file  . Food insecurity - worry: Not on file  . Food insecurity - inability: Not on file  . Transportation needs - medical: Not on file  . Transportation needs - non-medical: Not on file  Occupational History  . Not on file  Tobacco Use  . Smoking status: Former Research scientist (life sciences)  . Smokeless tobacco: Never Used  Substance and Sexual Activity  . Alcohol use: No    Alcohol/week: 0.0 oz    Comment: socially   . Drug use: Yes    Types: Marijuana    Comment: "2-3 hits at night to help w/ anxiety"  . Sexual activity: Yes  Other Topics Concern  . Not on file  Social History Narrative   Retired    Has a girlfriend    No children       He likes to hike, read, Engineering geologist  Physical Exam: BP 112/80   Pulse 74   Ht 6' (1.829 m)   Wt 224 lb 2 oz (101.7 kg)   BMI 30.40 kg/m  Constitutional: generally well-appearing Psychiatric: alert and oriented x3 Eyes: extraocular movements intact Mouth: oral pharynx moist, no lesions Neck: supple no lymphadenopathy Cardiovascular: heart regular rate and rhythm Lungs: clear to auscultation bilaterally Abdomen: soft, nontender, nondistended, no obvious ascites, no peritoneal signs, normal bowel sounds Extremities: no lower extremity edema bilaterally Skin: no lesions on visible extremities   Assessment and plan: 66 y.o. male with personal history of adenomatous colon polyps  He believes his last colonoscopy was in 2004.  Pathology from that procedure showed he had at least one tubular adenoma.  I recommend we repeat colonoscopy at his soonest convenience for polyp surveillance.  I see no reason for any  further blood tests or imaging studies prior to then    Please see the "Patient Instructions" section for addition details about the plan.   Owens Loffler, MD [facility] 08/14/2017, 2:48 PM  Cc: Lucretia Kern, DO

## 2017-09-10 ENCOUNTER — Encounter: Payer: Self-pay | Admitting: Gastroenterology

## 2017-09-21 ENCOUNTER — Encounter: Payer: Medicare HMO | Admitting: Gastroenterology

## 2017-12-19 ENCOUNTER — Ambulatory Visit: Payer: Medicare HMO | Admitting: Adult Health

## 2018-01-24 ENCOUNTER — Ambulatory Visit: Payer: Medicare HMO | Admitting: Adult Health

## 2019-06-24 ENCOUNTER — Encounter: Payer: Self-pay | Admitting: Gastroenterology

## 2019-07-22 ENCOUNTER — Encounter: Payer: Self-pay | Admitting: Gastroenterology

## 2019-07-22 ENCOUNTER — Ambulatory Visit (AMBULATORY_SURGERY_CENTER): Payer: Medicare Other | Admitting: *Deleted

## 2019-07-22 ENCOUNTER — Other Ambulatory Visit: Payer: Self-pay

## 2019-07-22 VITALS — Temp 97.5°F | Ht 72.0 in | Wt 239.0 lb

## 2019-07-22 DIAGNOSIS — Z860100 Personal history of colon polyps, unspecified: Secondary | ICD-10-CM

## 2019-07-22 DIAGNOSIS — Z800 Family history of malignant neoplasm of digestive organs: Secondary | ICD-10-CM

## 2019-07-22 DIAGNOSIS — Z1159 Encounter for screening for other viral diseases: Secondary | ICD-10-CM

## 2019-07-22 MED ORDER — PEG 3350-KCL-NA BICARB-NACL 420 G PO SOLR: 4000.0000 mL | Freq: Once | ORAL | 0 refills | Status: AC

## 2019-07-22 NOTE — Progress Notes (Signed)

## 2019-07-31 ENCOUNTER — Other Ambulatory Visit: Payer: Self-pay | Admitting: Gastroenterology

## 2019-08-01 ENCOUNTER — Telehealth: Payer: Self-pay | Admitting: Gastroenterology

## 2019-08-01 LAB — SARS CORONAVIRUS 2 (TAT 6-24 HRS): SARS Coronavirus 2: NEGATIVE

## 2019-08-04 ENCOUNTER — Telehealth: Payer: Self-pay | Admitting: *Deleted

## 2019-08-04 DIAGNOSIS — Z1159 Encounter for screening for other viral diseases: Secondary | ICD-10-CM

## 2019-08-04 NOTE — Telephone Encounter (Signed)
LMOM that covid test will need to be done 2 business days prior to procedure ( on 08-08-19).,

## 2019-08-04 NOTE — Telephone Encounter (Signed)
Spoke with pt and covid test set up for 08-08-19 at 1135

## 2019-08-05 ENCOUNTER — Encounter: Payer: Medicare HMO | Admitting: Gastroenterology

## 2019-08-08 LAB — SARS CORONAVIRUS 2 (TAT 6-24 HRS): SARS Coronavirus 2: NEGATIVE

## 2019-08-13 ENCOUNTER — Encounter: Payer: Self-pay | Admitting: Gastroenterology

## 2019-08-13 ENCOUNTER — Other Ambulatory Visit: Payer: Self-pay

## 2019-08-13 ENCOUNTER — Ambulatory Visit (AMBULATORY_SURGERY_CENTER): Payer: Medicare Other | Admitting: Gastroenterology

## 2019-08-13 VITALS — BP 105/51 | HR 67 | Temp 98.7°F | Resp 16 | Ht 72.0 in | Wt 239.0 lb

## 2019-08-13 DIAGNOSIS — Z800 Family history of malignant neoplasm of digestive organs: Secondary | ICD-10-CM

## 2019-08-13 DIAGNOSIS — Z86010 Personal history of colonic polyps: Secondary | ICD-10-CM

## 2019-08-13 DIAGNOSIS — D122 Benign neoplasm of ascending colon: Secondary | ICD-10-CM

## 2019-08-13 MED ORDER — SODIUM CHLORIDE 0.9 % IV SOLN: 500.0000 mL | Freq: Once | INTRAVENOUS | Status: DC

## 2019-08-13 NOTE — Progress Notes (Signed)
Report given to PACU, vss 

## 2019-08-13 NOTE — Progress Notes (Signed)
Pt's states no medical or surgical changes since previsit or office visit.  TEMP-JB  V/S-SB 

## 2019-08-13 NOTE — Patient Instructions (Signed)
YOU HAD AN ENDOSCOPIC PROCEDURE TODAY AT [facility]:   Refer to the procedure report that was given to you for any specific questions about what was found during the examination.  If the procedure report does not answer your questions, please call your gastroenterologist to clarify.  If you requested that your care partner not be given the details of your procedure findings, then the procedure report has been included in a sealed envelope for you to review at your convenience later.  YOU SHOULD EXPECT: Some feelings of bloating in the abdomen. Passage of more gas than usual.  Walking can help get rid of the air that was put into your GI tract during the procedure and reduce the bloating. If you had a lower endoscopy (such as a colonoscopy or flexible sigmoidoscopy) you may notice spotting of blood in your stool or on the toilet paper. If you underwent a bowel prep for your procedure, you may not have a normal bowel movement for a few days.  Please Note:  You might notice some irritation and congestion in your nose or some drainage.  This is from the oxygen used during your procedure.  There is no need for concern and it should clear up in a day or so.  SYMPTOMS TO REPORT IMMEDIATELY:   Following lower endoscopy (colonoscopy or flexible sigmoidoscopy):  Excessive amounts of blood in the stool  Significant tenderness or worsening of abdominal pains  Swelling of the abdomen that is new, acute  Fever of 100F or higher  For urgent or emergent issues, a gastroenterologist can be reached at any hour by calling [phone].  DIET:  We do recommend a small meal at first, but then you may proceed to your regular diet.  Drink plenty of fluids but you should avoid alcoholic beverages for 24 hours.  ACTIVITY:  You should plan to take it easy for the rest of today and you should NOT DRIVE or use heavy machinery until tomorrow (because of the sedation medicines used during the test).     FOLLOW UP: Our staff will call the number listed on your records 48-72 hours following your procedure to check on you and address any questions or concerns that you may have regarding the information given to you following your procedure. If we do not reach you, we will leave a message.  We will attempt to reach you two times.  During this call, we will ask if you have developed any symptoms of COVID 19. If you develop any symptoms (ie: fever, flu-like symptoms, shortness of breath, cough etc.) before then, please call [phone].  If you test positive for Covid 19 in the 2 weeks post procedure, please call and report this information to [place].    If any biopsies were taken you will be contacted by phone or by letter within the next 1-3 weeks.  Please call us at [phone] if you have not heard about the biopsies in 3 weeks.    SIGNATURES/CONFIDENTIALITY: You and/or your care partner have signed paperwork which will be entered into your electronic medical record.  These signatures attest to the fact that that the information above on your After Visit Summary has been reviewed and is understood.  Full responsibility of the confidentiality of this discharge information lies with you and/or your care-partner.  Await pathology  Please read over handouts about polyps, diverticulosis and high fiber diets  Continue your normal medications

## 2019-08-13 NOTE — Op Note (Signed)
Nathaniel Cross: Nathaniel Cross Procedure Date: 08/13/2019 8:52 AM MRN: [id number] Endoscopist: Milus Banister , MD Age: 68 Referring MD:  Date of Birth: 10/22/1950 Gender: Male Account #: [id number] Procedure:                Colonoscopy Indications:              High risk colon cancer surveillance: Personal                            history of colonic polyps; TA removed in 2004, also                            father had colon cancer diagnosed in his 23s Medicines:                Monitored Anesthesia Care Procedure:                Pre-Anesthesia Assessment:                           - Prior to the procedure, a History and Physical                            was performed, and patient medications and                            allergies were reviewed. The patient's tolerance of                            previous anesthesia was also reviewed. The risks                            and benefits of the procedure and the sedation                            options and risks were discussed with the patient.                            All questions were answered, and informed consent                            was obtained. Prior Anticoagulants: The patient has                            taken no previous anticoagulant or antiplatelet                            agents. ASA Grade Assessment: II - A patient with                            mild systemic disease. After reviewing the risks                            and benefits, the patient was deemed in  satisfactory condition to undergo the procedure.                           After obtaining informed consent, the colonoscope                            was passed under direct vision. Throughout the                            procedure, the patient's blood pressure, pulse, and                            oxygen saturations were monitored continuously. The                            Colonoscope was  introduced through the anus and                            advanced to the the cecum, identified by                            appendiceal orifice and ileocecal valve. The                            colonoscopy was performed without difficulty. The                            patient tolerated the procedure well. The quality                            of the bowel preparation was good. The ileocecal                            valve, appendiceal orifice, and rectum were                            photographed. Scope In: 8:59:06 AM Scope Out: 9:11:00 AM Scope Withdrawal Time: 0 hours 10 minutes 10 seconds  Total Procedure Duration: 0 hours 11 minutes 54 seconds  Findings:                 Two sessile polyps were found in the ascending                            colon. The polyps were 3 to 5 mm in size. These                            polyps were removed with a cold snare. Resection                            and retrieval were complete.                           Multiple small-mouthed diverticula were found in  the left colon.                           The exam was otherwise without abnormality on                            direct and retroflexion views. Complications:            No immediate complications. Estimated blood loss:                            None. Estimated Blood Loss:     Estimated blood loss: none. Impression:               - Two 3 to 5 mm polyps in the ascending colon,                            removed with a cold snare. Resected and retrieved.                           - Diverticulosis in the left colon.                           - The examination was otherwise normal on direct                            and retroflexion views. Recommendation:           - Patient has a contact number available for                            emergencies. The signs and symptoms of potential                            delayed complications were discussed with the                             patient. Return to normal activities tomorrow.                            Written discharge instructions were provided to the                            patient.                           - Resume previous diet.                           - Continue present medications.                           - Await pathology results. Milus Banister, MD 08/13/2019 9:13:54 AM This report has been signed electronically.

## 2019-08-15 ENCOUNTER — Telehealth: Payer: Self-pay | Admitting: *Deleted

## 2019-08-15 ENCOUNTER — Telehealth: Payer: Self-pay

## 2019-08-15 NOTE — Telephone Encounter (Signed)
  Follow up Call-  Call back number 08/13/2019  Post procedure Call Back phone  # [phone]  Permission to leave phone message Yes  Some recent data might be hidden     No ID on voicemail No message left Will try again midday

## 2019-08-15 NOTE — Telephone Encounter (Signed)
  Follow up Call-  Call back number 08/13/2019  Post procedure Call Back phone  # [phone]  Permission to leave phone message Yes  Some recent data might be hidden     Patient questions:  Do you have a fever, pain , or abdominal swelling? No. Pain Score  0 *  Have you tolerated food without any problems? Yes.    Have you been able to return to your normal activities? Yes.    Do you have any questions about your discharge instructions: Diet   No. Medications  No. Follow up visit  No.  Do you have questions or concerns about your Care? No.  Actions: * If pain score is 4 or above: No action needed, pain <4.   1. Have you developed a fever since your procedure? no  2.   Have you had an respiratory symptoms (SOB or cough) since your procedure? no  3.   Have you tested positive for COVID 19 since your procedure no  4.   Have you had any family members/close contacts diagnosed with the COVID 19 since your procedure?  no   If yes to any of these questions please route to Joylene John, RN and Alphonsa Gin, Therapist, sports.

## 2019-08-18 ENCOUNTER — Encounter: Payer: Self-pay | Admitting: Gastroenterology

## 2019-09-02 NOTE — Telephone Encounter (Signed)
error 

## 2020-08-13 ENCOUNTER — Other Ambulatory Visit: Payer: Self-pay | Admitting: Family

## 2021-08-16 ENCOUNTER — Other Ambulatory Visit: Payer: Self-pay | Admitting: Neurosurgery

## 2021-08-16 DIAGNOSIS — M5441 Lumbago with sciatica, right side: Secondary | ICD-10-CM

## 2021-08-16 DIAGNOSIS — G8929 Other chronic pain: Secondary | ICD-10-CM

## 2021-08-31 ENCOUNTER — Other Ambulatory Visit: Payer: Self-pay | Admitting: Family Medicine

## 2021-08-31 DIAGNOSIS — I714 Abdominal aortic aneurysm, without rupture, unspecified: Secondary | ICD-10-CM

## 2021-09-01 ENCOUNTER — Ambulatory Visit
Admission: RE | Admit: 2021-09-01 | Discharge: 2021-09-01 | Disposition: A | Discharge status: Home / Self Care | Payer: Medicare Other | Source: Ambulatory Visit | Attending: Family Medicine | Admitting: Family Medicine

## 2021-09-01 DIAGNOSIS — I714 Abdominal aortic aneurysm, without rupture, unspecified: Secondary | ICD-10-CM

## 2021-09-09 ENCOUNTER — Other Ambulatory Visit: Payer: Self-pay

## 2021-09-09 ENCOUNTER — Ambulatory Visit
Admission: RE | Admit: 2021-09-09 | Discharge: 2021-09-09 | Disposition: A | Discharge status: Home / Self Care | Payer: Medicare Other | Source: Ambulatory Visit | Attending: Neurosurgery | Admitting: Neurosurgery

## 2021-09-09 DIAGNOSIS — G8929 Other chronic pain: Secondary | ICD-10-CM

## 2022-06-07 IMAGING — MR MR LUMBAR SPINE W/O CM
4 of 5 series · 25 of 48 positions shown · non-contrast
Comparison: MRI lumbar spine 03/09/2016 [REDACTED]. Lumbar spine radiographs 08/16/2021

CLINICAL DATA: Low back pain extending into the right lower
extremity for 6 years.

EXAM:
MRI LUMBAR SPINE WITHOUT CONTRAST
TECHNIQUE: Multiplanar, multisequence MR imaging of the lumbar spine was
performed. No intravenous contrast was administered.

[Series 3: T2 · sagittal · 4.0mm · 0.59mm/px · 6 of 19 slices shown (1 of 2)]
[im 1/19]
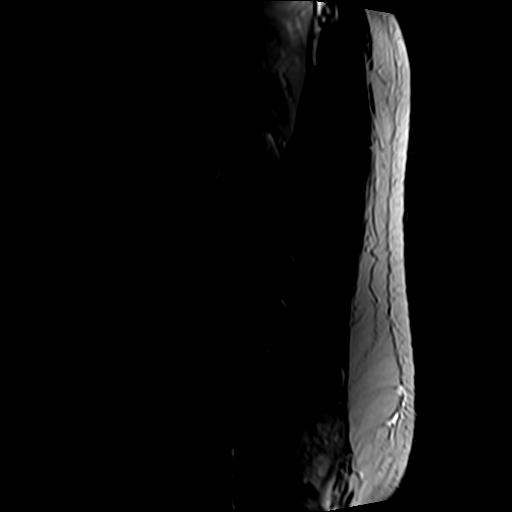
[im 4/19]
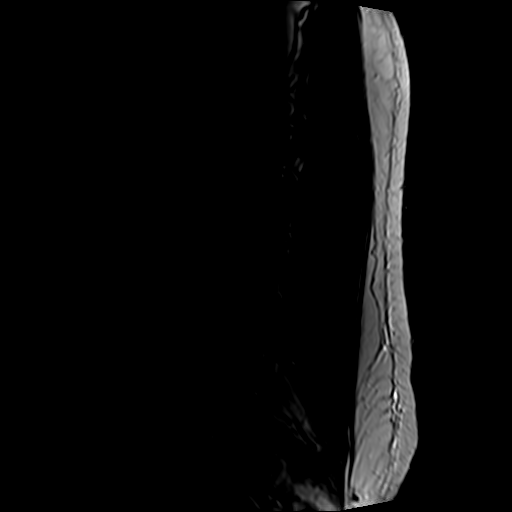
[im 8/19]
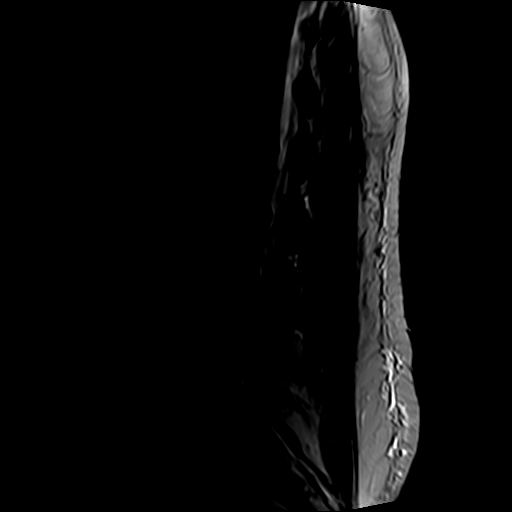
[im 11/19]
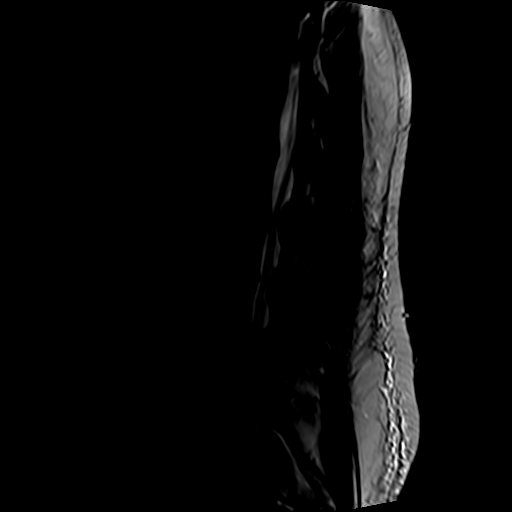
[im 15/19]
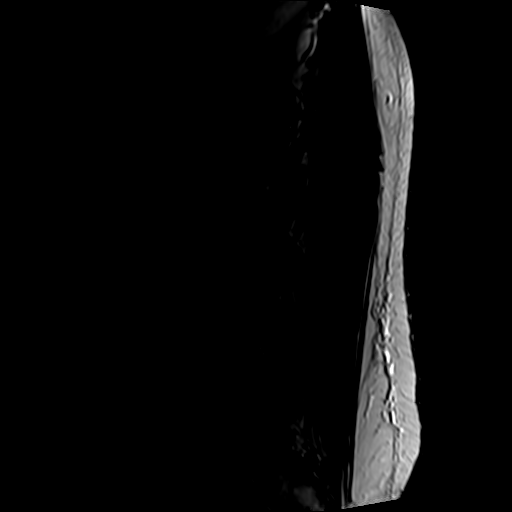
[im 19/19]
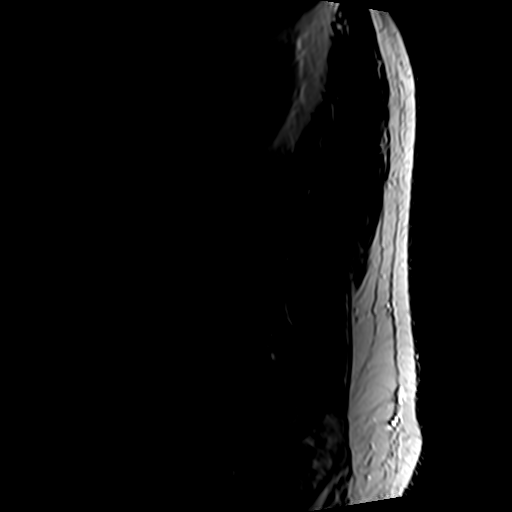

[Series 5: T1 · sagittal · 4.0mm · 0.59mm/px · 6 of 19 slices shown (1 of 2)]
[im 1/19]
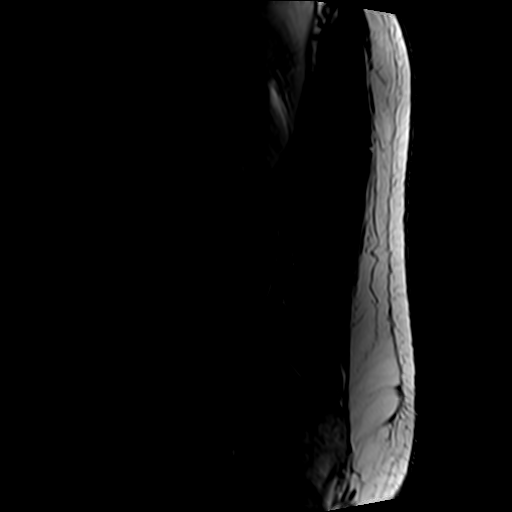
[im 4/19]
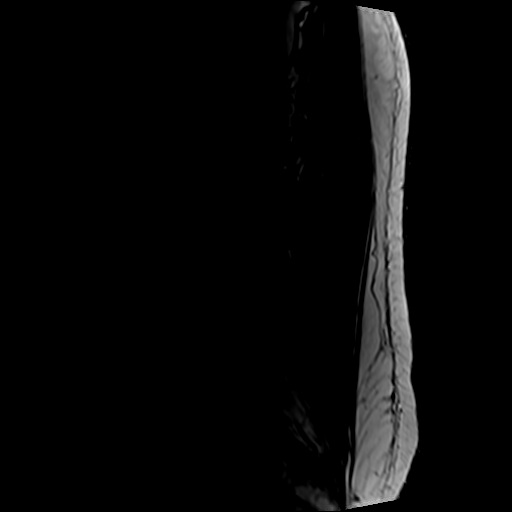
[im 8/19]
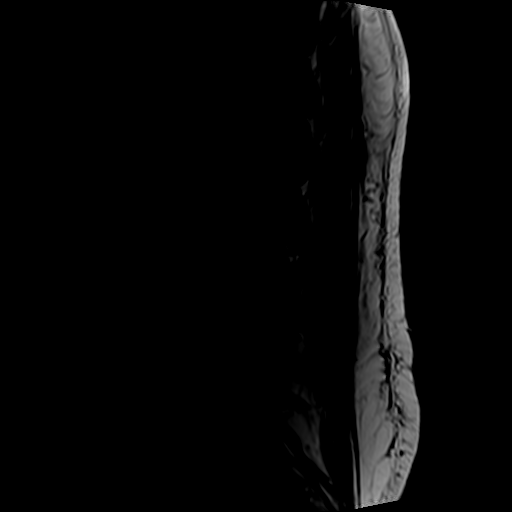
[im 11/19]
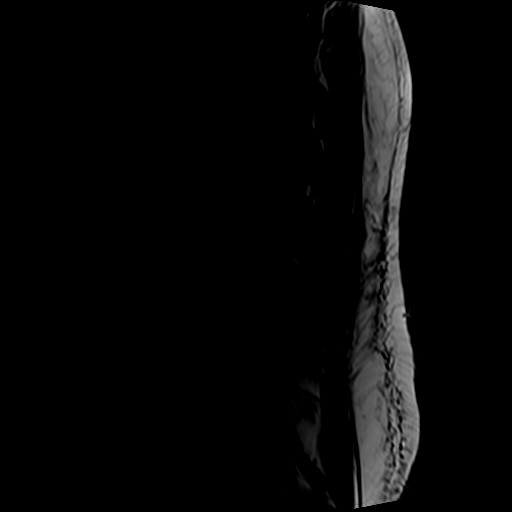
[im 15/19]
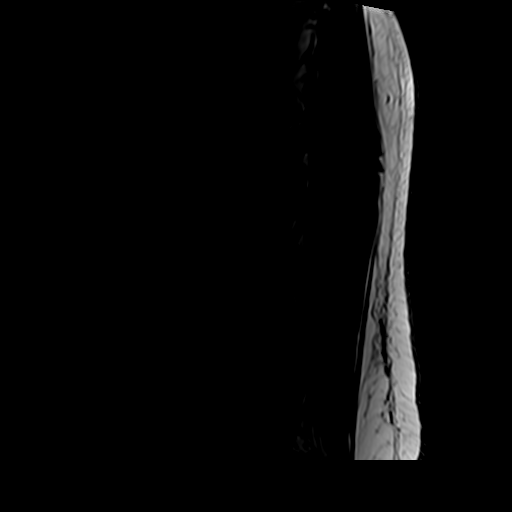
[im 19/19]
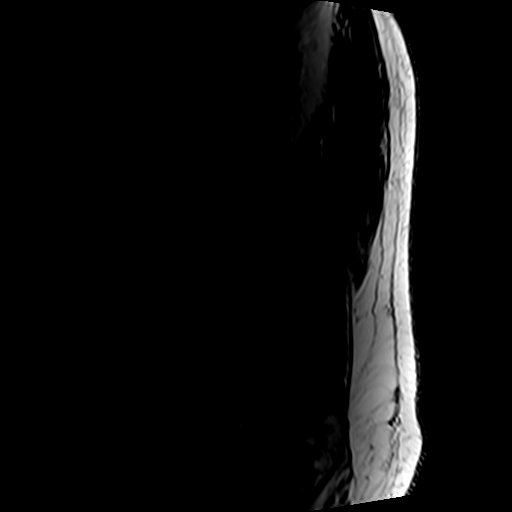

[Series 6: T2 · axial · 4.0mm · 0.78mm/px · z∈[-119,+127]mm · 9 of 47 slices shown (2 of 2)]
[im 1/47]
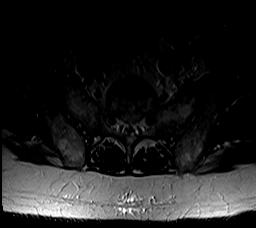
[im 7/47]
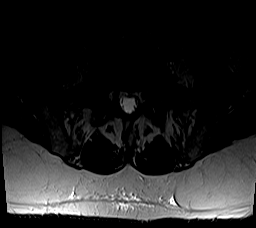
[im 14/47]
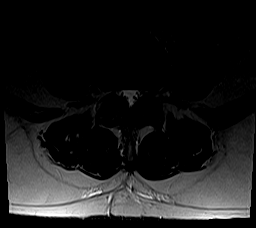
[im 20/47]
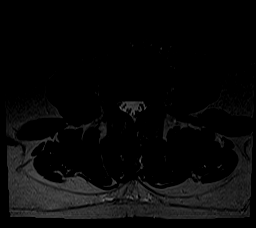
[im 24/47]
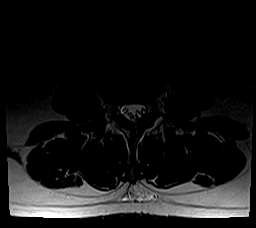
[im 27/47]
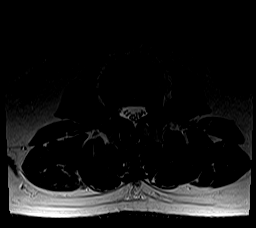
[im 33/47]
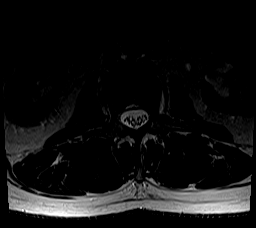
[im 40/47]
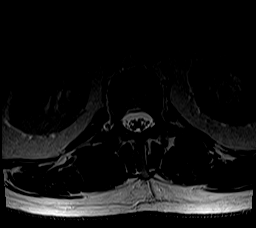
[im 47/47]
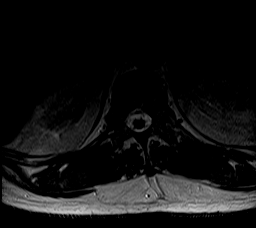

[Series 7: T1 · axial · 4.0mm · 0.39mm/px · z∈[-119,+91]mm · 4 of 47 slices shown (2 of 2)]
[im 1/47]
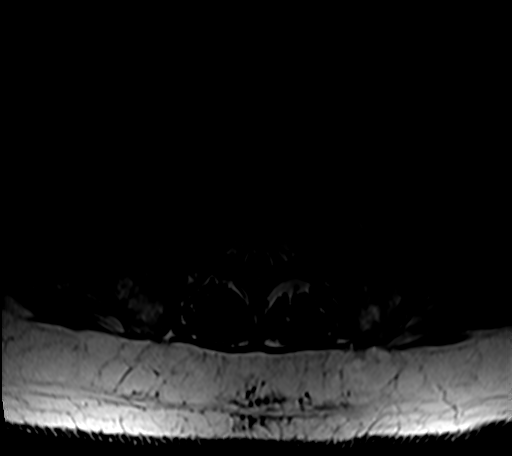
[im 7/47]
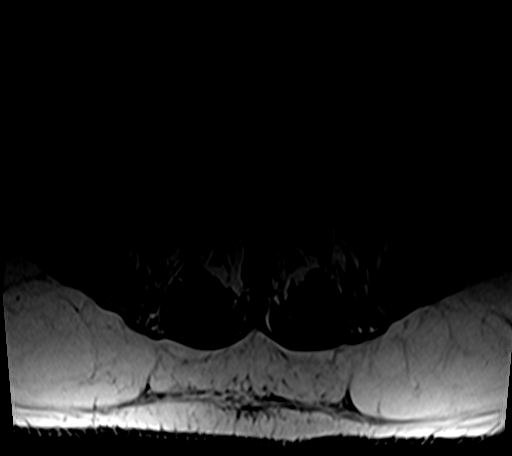
[im 24/47]
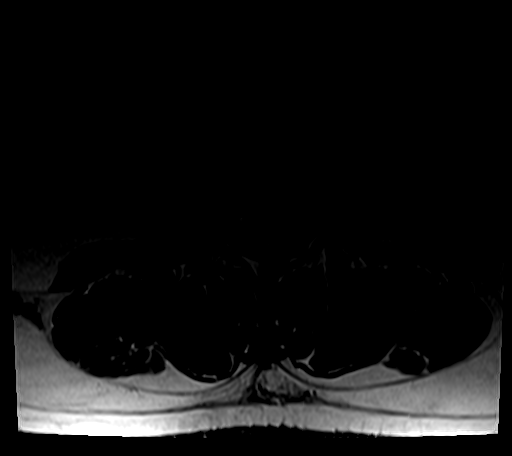
[im 40/47]
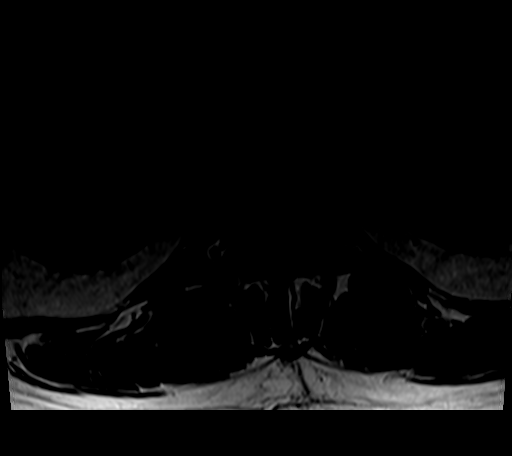

[25 of 48 positions shown; findings below may reference images not displayed]

FINDINGS: Segmentation: 5 non rib-bearing lumbar type vertebral bodies are
present. The lowest fully formed vertebral body is L5.

Alignment: Slight retrolisthesis is again seen at L4-5, similar to
the prior exams. No other significant listhesis is present.
Straightening of the lumbar lordosis is stable. Mild rightward
curvature is centered at L3.

Vertebrae: Marrow signal and vertebral body heights are normal.
Superior endplate Schmorl's node at L3 is new. Marrow signal and
vertebral body heights are otherwise normal.

Conus medullaris and cauda equina: Conus extends to the L1-2 level.
Conus and cauda equina appear normal.

Paraspinal and other soft tissues: Limited imaging the abdomen is
unremarkable. There is no significant adenopathy. No solid organ
lesions are present.

Disc levels:

T12-L1: Negative.

L1-2: Disc desiccation noted without significant protrusion or
stenosis. Mild facet hypertrophy is present.

L2-3: A progressive broad-based disc protrusion is present. Moderate
facet hypertrophy is present. Mild right subarticular narrowing is
present. Moderate foraminal narrowing bilaterally has progressed.

L3-4: Broad-based disc protrusion and mild facet hypertrophy is
noted. Mild left subarticular and bilateral foraminal narrowing
demonstrates some progression. Foraminal narrowing is worse on the
right.

L4-5: A broad-based disc protrusion is present. Far right annular
tear is noted. Mild right subarticular and moderate bilateral
foraminal narrowing demonstrates progression, right greater than
left.

L5-S1: Mild facet hypertrophy is noted bilaterally. No significant
disc protrusion or stenosis is present.
IMPRESSION: 1. Progressive multilevel spondylosis of the lumbar spine as
described.
2. Mild right subarticular and moderate bilateral foraminal stenosis
at L2-3 has progressed.
3. Mild left subarticular and bilateral foraminal narrowing at L3-4
demonstrates some progression. Foraminal narrowing is worse on the
right.
4. Mild right subarticular and moderate bilateral foraminal stenosis
at L4-5 demonstrates progression. Foraminal narrowing is worse on
the right.
